# Patient Record
Sex: Male | Born: 1995 | Race: Black or African American | Hispanic: No | Marital: Single | Smoking: Never smoker
Health system: Southern US, Community
[De-identification: ages and names within clinical notes are randomized; demographics above are authoritative.]

---
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
## 2018-09-17 ENCOUNTER — Other Ambulatory Visit: Payer: Self-pay

## 2018-09-17 ENCOUNTER — Encounter (HOSPITAL_COMMUNITY): Payer: Self-pay

## 2018-09-17 ENCOUNTER — Ambulatory Visit (HOSPITAL_COMMUNITY)
Admission: EM | Admit: 2018-09-17 | Discharge: 2018-09-17 | Disposition: A | Discharge status: Home / Self Care | Payer: Medicaid Other | Attending: Family Medicine | Admitting: Family Medicine

## 2018-09-17 DIAGNOSIS — R369 Urethral discharge, unspecified: Secondary | ICD-10-CM | POA: Insufficient documentation

## 2018-09-17 MED ORDER — LIDOCAINE HCL 2 % IJ SOLN
INTRAMUSCULAR | Status: AC
  Filled 2018-09-17: qty 20

## 2018-09-17 MED ORDER — AZITHROMYCIN 250 MG PO TABS
1000.0000 mg | ORAL_TABLET | Freq: Once | ORAL | Status: AC
  Administered 2018-09-17: 1000 mg via ORAL

## 2018-09-17 MED ORDER — CEFTRIAXONE SODIUM 250 MG IJ SOLR
250.0000 mg | Freq: Once | INTRAMUSCULAR | Status: AC
  Administered 2018-09-17: 250 mg via INTRAMUSCULAR

## 2018-09-17 MED ORDER — AZITHROMYCIN 250 MG PO TABS
ORAL_TABLET | ORAL | Status: AC
  Filled 2018-09-17: qty 4

## 2018-09-17 MED ORDER — CEFTRIAXONE SODIUM 250 MG IJ SOLR
INTRAMUSCULAR | Status: AC
  Filled 2018-09-17: qty 250

## 2018-09-17 NOTE — ED Triage Notes (Signed)
Pt cc he has yellow penile discharge x 1 week.

## 2018-09-17 NOTE — ED Provider Notes (Signed)
[facility]    CSN: [id number] Arrival date & time: 09/17/18  [id number]     History   Chief Complaint Chief Complaint  Patient presents with  . Penile Discharge    HPI Jerry Gardner is a 23 y.o. male.   Pt is a [age] year old male that presents with penile discharge.  Symptoms have been constant x1 week.  He describes the discharge as yellow and thick.  Denies any dysuria, abdominal pain or fevers.  Reporting multiple sexual partners.  ROS per HPI      History reviewed. No pertinent past medical history.  There are no active problems to display for this patient.   History reviewed. No pertinent surgical history.     Home Medications    Prior to Admission medications   Not on File    Family History History reviewed. No pertinent family history.  Social History Social History   Tobacco Use  . Smoking status: Never Smoker  . Smokeless tobacco: Never Used  Substance Use Topics  . Alcohol use: No  . Drug use: Not on file     Allergies   Patient has no known allergies.   Review of Systems Review of Systems   Physical Exam Triage Vital Signs ED Triage Vitals [09/17/18 0908]  Enc Vitals Group     BP      Pulse      Resp      Temp      Temp src      SpO2      Weight 132 lb (59.9 kg)     Height      Head Circumference      Peak Flow      Pain Score 0     Pain Loc      Pain Edu?      Excl. in GC?    No data found.  Updated Vital Signs BP 120/76 (BP Location: Left Arm)   Pulse 79   Temp 97.8 F (36.6 C) (Oral)   Resp 16   Wt 132 lb (59.9 kg)   SpO2 100%   Visual Acuity Right Eye Distance:   Left Eye Distance:   Bilateral Distance:    Right Eye Near:   Left Eye Near:    Bilateral Near:     Physical Exam Vitals signs and nursing note reviewed.  Constitutional:      Appearance: Normal appearance.  HENT:     Head: Normocephalic and atraumatic.     Nose: Nose normal.  Eyes:     Conjunctiva/sclera: Conjunctivae  normal.  Neck:     Musculoskeletal: Normal range of motion.  Pulmonary:     Effort: Pulmonary effort is normal.  Abdominal:     Palpations: Abdomen is soft.     Tenderness: There is no abdominal tenderness.  Musculoskeletal: Normal range of motion.  Skin:    General: Skin is warm and dry.  Neurological:     Mental Status: He is alert.  Psychiatric:        Mood and Affect: Mood normal.      UC Treatments / Results  Labs (all labs ordered are listed, but only abnormal results are displayed) Labs Reviewed  URINE CYTOLOGY ANCILLARY ONLY    EKG None  Radiology No results found.  Procedures Procedures (including critical care time)  Medications Ordered in UC Medications  cefTRIAXone (ROCEPHIN) injection 250 mg (has no administration in time range)  azithromycin (ZITHROMAX) tablet 1,000 mg (has no administration  in time range)    Initial Impression / Assessment and Plan / UC Course  I have reviewed the triage vital signs and the nursing notes.  Pertinent labs & imaging results that were available during my care of the patient were reviewed by me and considered in my medical decision making (see chart for details).     Based on symptoms and sexual history we will go ahead and treat prophylactically for gonorrhea and chlamydia. Sending urine for cytology Labs pending Final Clinical Impressions(s) / UC Diagnoses   Final diagnoses:  Penile discharge     Discharge Instructions     We are treating you prophylactically today for gonorrhea and chlamydia.  Please refrain from sexual activity for at least 7 days and recommend telling all of your sexual partners of your symptoms and treatment of STDs. We will call you with your positive results    ED Prescriptions    None     Controlled Substance Prescriptions [place] Controlled Substance Registry consulted? Not Applicable   Janace Aris, NP 09/17/18 [phone]

## 2018-09-17 NOTE — Discharge Instructions (Addendum)
We are treating you prophylactically today for gonorrhea and chlamydia.  Please refrain from sexual activity for at least 7 days and recommend telling all of your sexual partners of your symptoms and treatment of STDs. We will call you with your positive results

## 2018-09-18 ENCOUNTER — Telehealth (HOSPITAL_COMMUNITY): Payer: Self-pay | Admitting: Emergency Medicine

## 2018-09-18 LAB — URINE CYTOLOGY ANCILLARY ONLY
Chlamydia: POSITIVE — AB
Neisseria Gonorrhea: POSITIVE — AB
Trichomonas: NEGATIVE

## 2018-09-18 NOTE — Telephone Encounter (Signed)
Chlamydia is positive.  This was treated at the urgent care visit with po zithromax 1g.  Pt needs education to please refrain from sexual intercourse for 7 days to give the medicine time to work.  Sexual partners need to be notified and tested/treated.  Condoms may reduce risk of reinfection.  Recheck or followup with PCP for further evaluation if symptoms are not improving.  [facility] notified.  Test for gonorrhea was positive. This was treated at the urgent care visit with IM rocephin 250mg and po zithromax 1g. Pt needs education to refrain from sexual intercourse for 7 days after treatment to give the medicine time to work. Sexual partners need to be notified and tested/treated. Condoms may reduce risk of reinfection. Recheck or followup with PCP for further evaluation if symptoms are not improving. [facility] notified.   Attempted to reach patient. No answer at this time. Voicemail left.    

## 2018-09-21 ENCOUNTER — Telehealth (HOSPITAL_COMMUNITY): Payer: Self-pay | Admitting: Emergency Medicine

## 2018-09-21 NOTE — Telephone Encounter (Signed)
Patient contacted and made aware of all results, all questions answered.   

## 2019-12-30 DEATH — deceased
